# Patient Record
Sex: Male | Born: 1995 | Race: White | Hispanic: No | Marital: Single
Health system: Southern US, Community
[De-identification: ages and names within clinical notes are randomized; demographics above are authoritative.]

## PROBLEM LIST (undated history)

## (undated) HISTORY — PX: APPENDECTOMY: SHX54

---
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
## 2017-01-22 IMAGING — CT CT HEAD WITHOUT CONTRAST
1 series · 16 of 30 positions shown, 20 images · non-contrast
Comparison: None.

CLINICAL DATA: Headache for 4 days.

EXAM:
CT HEAD WITHOUT CONTRAST
TECHNIQUE: Contiguous axial images were obtained from the base of the skull
through the vertex without intravenous contrast.

[Series 2: head wo · axial · 0.41mm/px · z∈[-595,-469]mm · 16 of 32 slices shown, 20 images]
[im 2/32  brain]
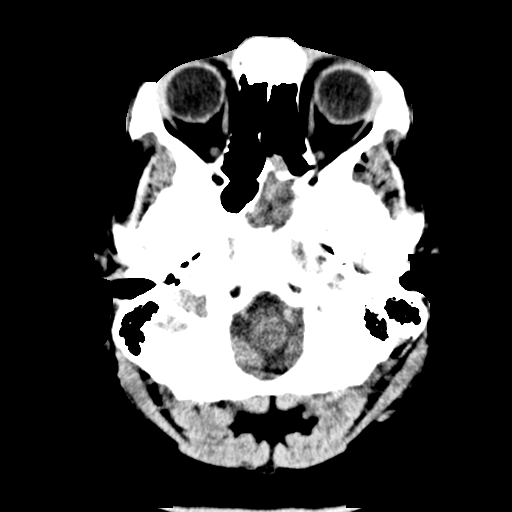
[im 2/32  bone]
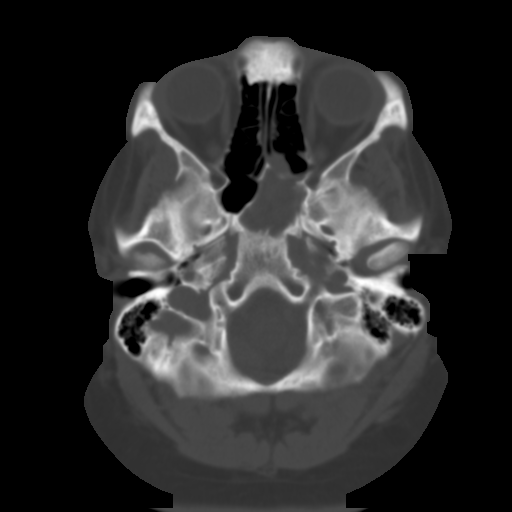
[im 4/32  brain]
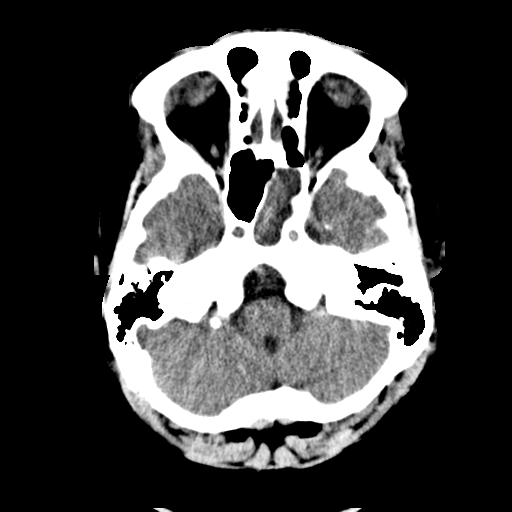
[im 6/32  brain]
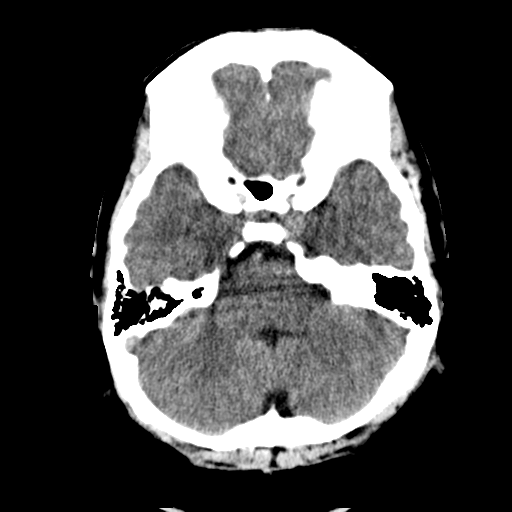
[im 8/32  brain]
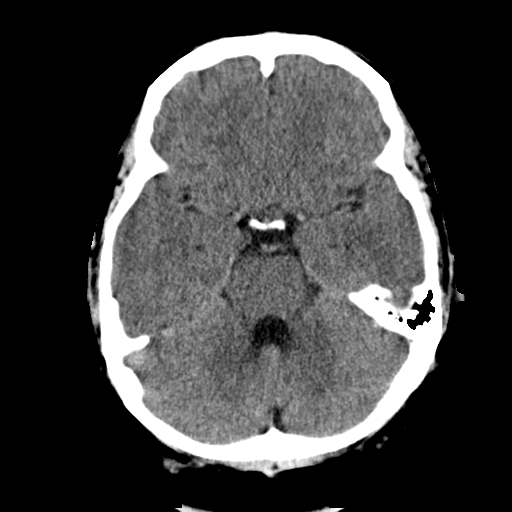
[im 9/32  brain]
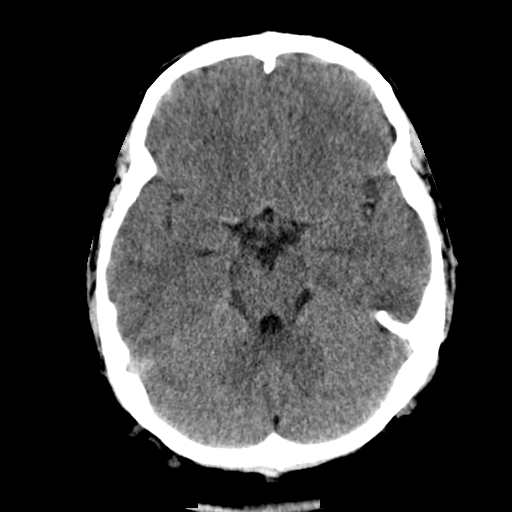
[im 9/32  bone]
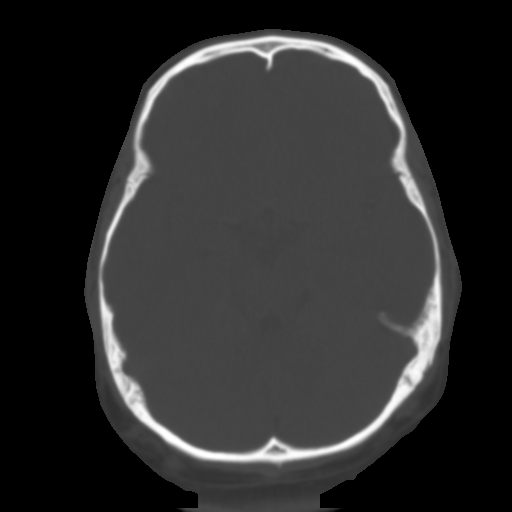
[im 11/32  brain]
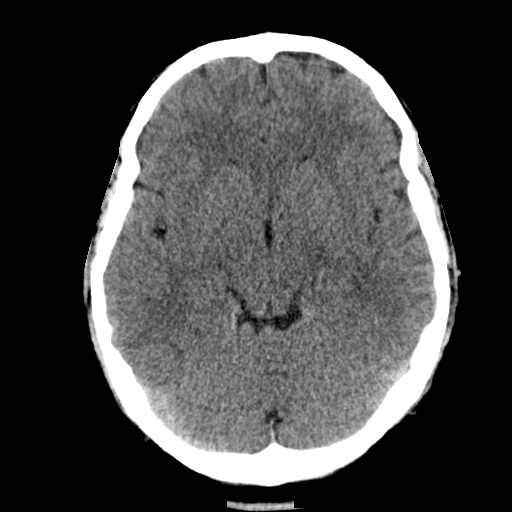
[im 13/32  brain]
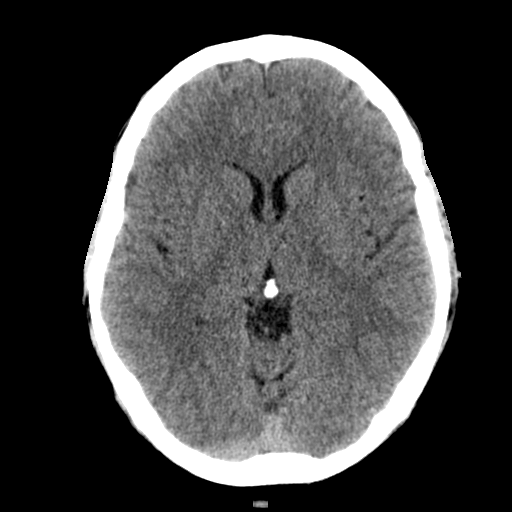
[im 15/32  brain]
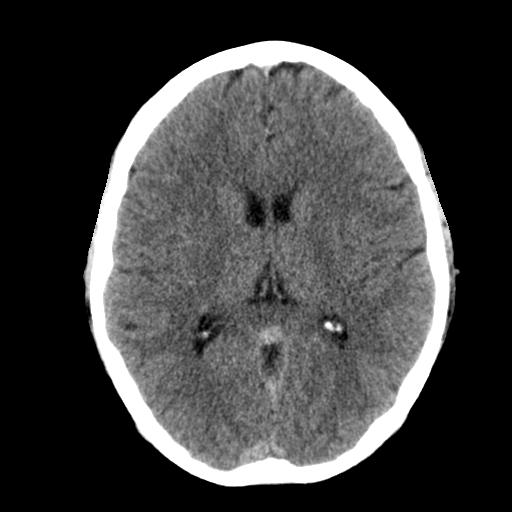
[im 17/32  brain]
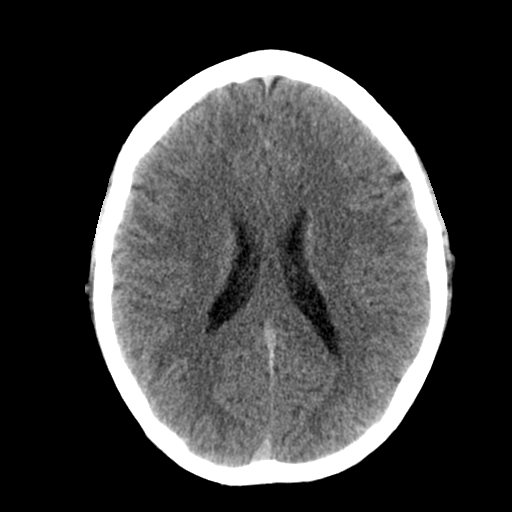
[im 17/32  bone]
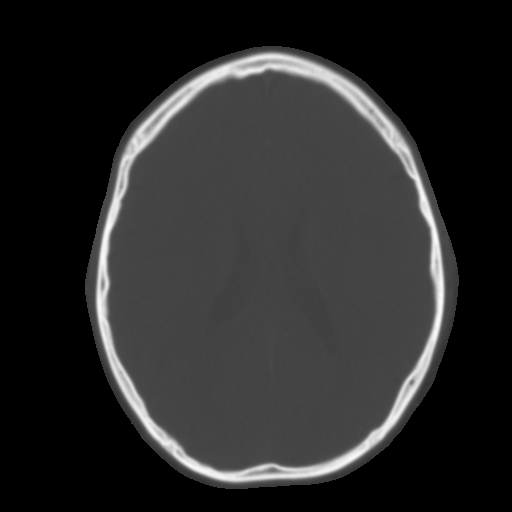
[im 19/32  brain]
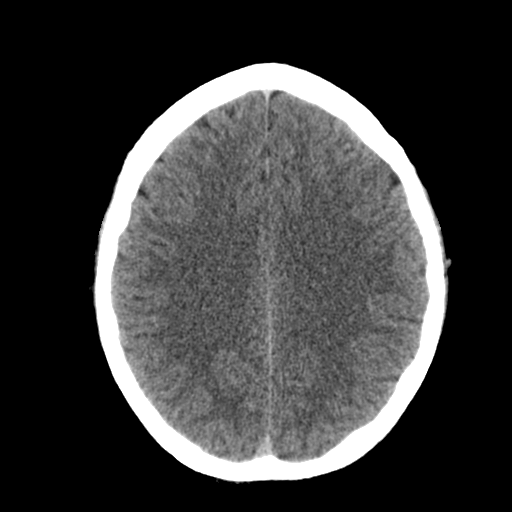
[im 21/32  brain]
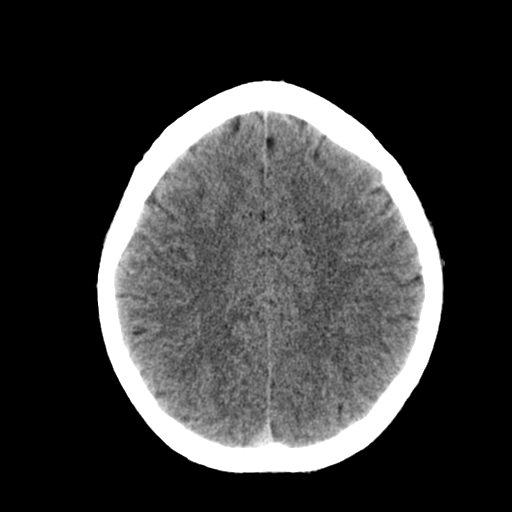
[im 23/32  brain]
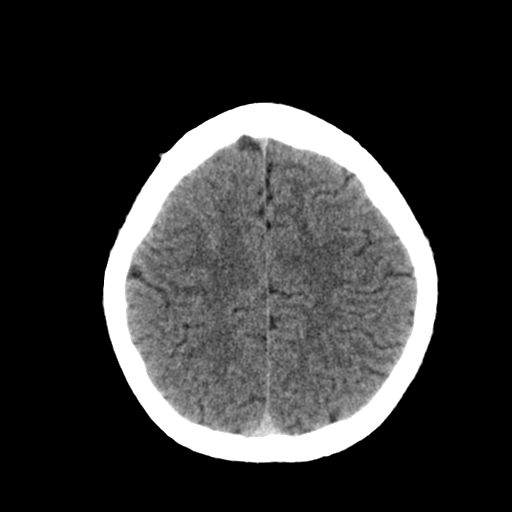
[im 24/32  brain]
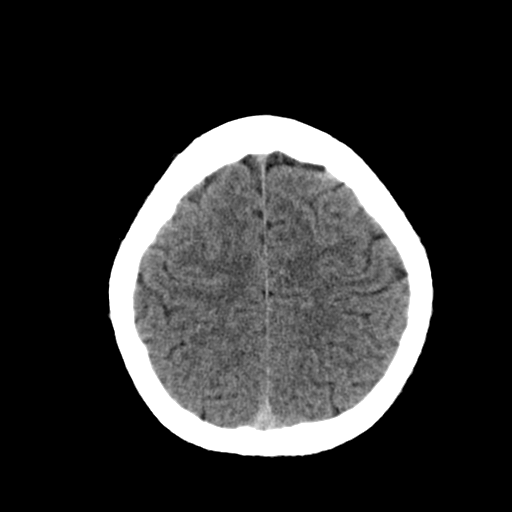
[im 24/32  bone]
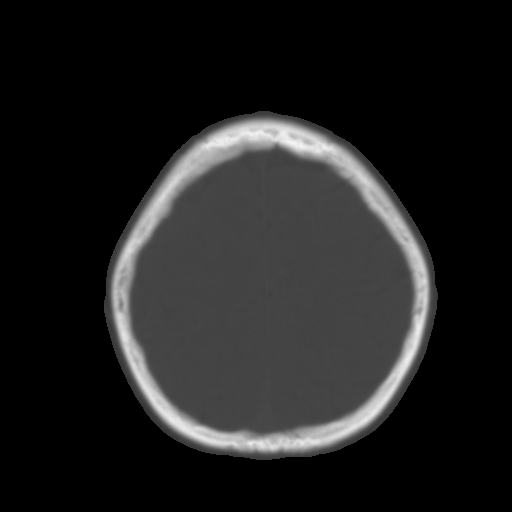
[im 26/32  brain]
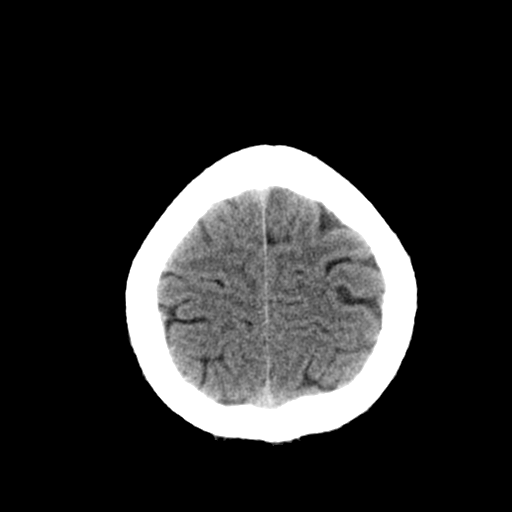
[im 28/32  brain]
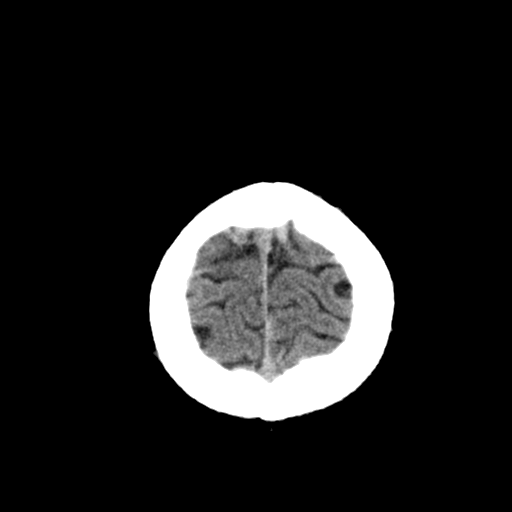
[im 30/32  brain]
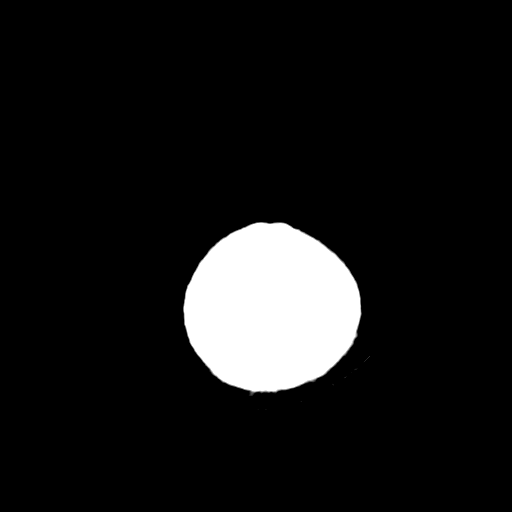

[16 of 30 positions shown; findings below may reference images not displayed]

FINDINGS: No skull fracture is noted. Paranasal sinuses and mastoid air cells
are unremarkable. No intracranial hemorrhage, mass effect or midline
shift. No hydrocephalus. No mass lesion is noted on this unenhanced
scan. No intra or extra-axial fluid collection.
IMPRESSION: No acute intracranial abnormality.
